# Patient Record
Sex: Female | Born: 1965 | Race: Black or African American | Hispanic: No | Marital: Single | Smoking: Never smoker
Health system: Southern US, Community
[De-identification: ages and names within clinical notes are randomized; demographics above are authoritative.]

## PROBLEM LIST (undated history)

## (undated) DIAGNOSIS — I10 Essential (primary) hypertension: Secondary | ICD-10-CM

## (undated) HISTORY — PX: ABDOMINAL HYSTERECTOMY: SHX81

---

## 2015-05-03 ENCOUNTER — Emergency Department
Admission: EM | Admit: 2015-05-03 | Discharge: 2015-05-03 | Disposition: A | Discharge status: Home / Self Care | Payer: Managed Care, Other (non HMO) | Attending: Emergency Medicine | Admitting: Emergency Medicine

## 2015-05-03 ENCOUNTER — Encounter: Payer: Self-pay | Admitting: Emergency Medicine

## 2015-05-03 DIAGNOSIS — N390 Urinary tract infection, site not specified: Secondary | ICD-10-CM | POA: Diagnosis not present

## 2015-05-03 DIAGNOSIS — Z79899 Other long term (current) drug therapy: Secondary | ICD-10-CM | POA: Insufficient documentation

## 2015-05-03 DIAGNOSIS — I10 Essential (primary) hypertension: Secondary | ICD-10-CM | POA: Diagnosis not present

## 2015-05-03 DIAGNOSIS — R350 Frequency of micturition: Secondary | ICD-10-CM | POA: Diagnosis present

## 2015-05-03 HISTORY — DX: Essential (primary) hypertension: I10

## 2015-05-03 LAB — URINALYSIS COMPLETE WITH MICROSCOPIC (ARMC ONLY)
Bacteria, UA: NONE SEEN
Bilirubin Urine: NEGATIVE
Glucose, UA: NEGATIVE mg/dL
Ketones, ur: NEGATIVE mg/dL
NITRITE: NEGATIVE
Protein, ur: NEGATIVE mg/dL
SPECIFIC GRAVITY, URINE: 1.015 (ref 1.005–1.030)
Squamous Epithelial / LPF: NONE SEEN
pH: 5 (ref 5.0–8.0)

## 2015-05-03 MED ORDER — CEPHALEXIN 500 MG PO CAPS: 500.0000 mg | ORAL_CAPSULE | Freq: Two times a day (BID) | ORAL | Status: AC

## 2015-05-03 NOTE — ED Provider Notes (Signed)
[facility] Emergency Department Provider Note  ____________________________________________  Time seen: Approximately 5:52 PM  I have reviewed the triage vital signs and the nursing notes.   HISTORY  Chief Complaint Urinary Frequency    HPI Gara Kroneramela Patnode is a 49 y.o. female with a history of occasional urinary tract infections and hypertension who presents with increased urinary frequency and discomfort while urinating for several days.  She has been going to the bathroom many times a day and always has discomfort which she describes as burning and pressure.  She has had no fever or chills, no abdominal pain, no flank pain, and no nausea or vomiting.  This feels similar to prior urinary tract infections.  She has been drinking cranberry juice but it is apparently not helped.   Past Medical History  Diagnosis Date  . Hypertension     There are no active problems to display for this patient.   Past Surgical History  Procedure Laterality Date  . Abdominal hysterectomy      Current Outpatient Rx  Name  Route  Sig  Dispense  Refill  . amLODipine (NORVASC) 5 MG tablet   Oral   Take 5 mg by mouth daily.         Marland Kitchen. aspirin 81 MG tablet   Oral   Take 81 mg by mouth daily.         Marland Kitchen. lisinopril-hydrochlorothiazide (PRINZIDE,ZESTORETIC) 20-25 MG per tablet   Oral   Take 1 tablet by mouth daily.         . cephALEXin (KEFLEX) 500 MG capsule   Oral   Take 1 capsule (500 mg total) by mouth 2 (two) times daily.   14 capsule   0     Allergies Review of patient's allergies indicates no known allergies.  History reviewed. No pertinent family history.  Social History History  Substance Use Topics  . Smoking status: Never Smoker   . Smokeless tobacco: Not on file  . Alcohol Use: No    Review of Systems Constitutional: No fever/chills Eyes: No visual changes. ENT: No sore throat. Cardiovascular: Denies chest pain. Respiratory: Denies  shortness of breath. Gastrointestinal: No abdominal pain.  No nausea, no vomiting.  No diarrhea.  No constipation. Genitourinary: Increased urinary frequency and discomfort Musculoskeletal: Negative for back pain nor flank pain Skin: Negative for rash. Neurological: Negative for headaches, focal weakness or numbness.  10-point ROS otherwise negative.  ____________________________________________   PHYSICAL EXAM:  VITAL SIGNS: ED Triage Vitals  Enc Vitals Group     BP 05/03/15 1647 143/83 mmHg     Pulse Rate 05/03/15 1647 84     Resp 05/03/15 1647 20     Temp 05/03/15 1647 97.9 F (36.6 C)     Temp Source 05/03/15 1647 Oral     SpO2 05/03/15 1647 99 %     Weight 05/03/15 1647 255 lb (115.667 kg)     Height 05/03/15 1647 5\' 7"  (1.702 m)     Head Cir --      Peak Flow --      Pain Score 05/03/15 1648 7     Pain Loc --      Pain Edu? --      Excl. in GC? --     Constitutional: Alert and oriented. Well appearing and in no acute distress. Eyes: Conjunctivae are normal. PERRL. EOMI. Head: Atraumatic. Nose: No congestion/rhinnorhea. Mouth/Throat: Mucous membranes are moist.  Oropharynx non-erythematous. Neck: No stridor.   Cardiovascular: Normal rate,  regular rhythm. Grossly normal heart sounds.  Good peripheral circulation. Respiratory: Normal respiratory effort.  No retractions. Lungs CTAB. Gastrointestinal: Soft and nontender. No distention. No abdominal bruits. No CVA tenderness. Musculoskeletal: No lower extremity tenderness nor edema.  No joint effusions. Neurologic:  Normal speech and language. No gross focal neurologic deficits are appreciated. Speech is normal. No gait instability. Skin:  Skin is warm, dry and intact. No rash noted. Psychiatric: Mood and affect are normal. Speech and behavior are normal.  ____________________________________________   LABS (all labs ordered are listed, but only abnormal results are displayed)  Labs Reviewed  URINALYSIS  COMPLETEWITH MICROSCOPIC ([facility])  - Abnormal; Notable for the following:    Color, Urine YELLOW (*)    APPearance CLOUDY (*)    Hgb urine dipstick 1+ (*)    Leukocytes, UA 3+ (*)    All other components within normal limits  URINE CULTURE   WBCs and RBCs = too numerous to count ____________________________________________  EKG  Not indicated ____________________________________________  RADIOLOGY  Not indicated  ____________________________________________   PROCEDURES  Procedure(s) performed: None  Critical Care performed: No  ____________________________________________   INITIAL IMPRESSION / ASSESSMENT AND PLAN / ED COURSE  Pertinent labs & imaging results that were available during my care of the patient were reviewed by me and considered in my medical decision making (see chart for details).  Uncomplicated urinary tract infection.  I will treat as per infectious disease recommendations with Keflex 500 mg twice a day 7 days.  I sent this sample to culture given that it was strongly positive and a want to ensure appropriate antibiotic coverage.  I gave the patient my usual and customary return precautions. ____________________________________________   FINAL CLINICAL IMPRESSION(S) / ED DIAGNOSES  Final diagnoses:  UTI (urinary tract infection), uncomplicated     Loleta Roseory Ayaan Shutes, MD 05/03/15 1805

## 2015-05-03 NOTE — ED Notes (Signed)
Having urinary freq and dysuria for the past few days

## 2015-05-03 NOTE — Discharge Instructions (Signed)
You have been seen in the Emergency Department (ED) today for pain when urinating.  Your workup today suggests that you have a urinary tract infection (UTI). ° °Please take your antibiotic as prescribed and over-the-counter pain medication (Tylenol or Motrin) as needed, but no more than recommended on the label instructions.  Drink PLENTY of fluids. ° °Call your regular doctor to schedule the next available appointment to follow up on today’s ED visit, or return immediately to the ED if your pain worsens, you have decreased urine production, develop fever, persistent vomiting, or other symptoms that concern you. ° ° °Urinary Tract Infection °Urinary tract infections (UTIs) can develop anywhere along your urinary tract. Your urinary tract is your body's drainage system for removing wastes and extra water. Your urinary tract includes two kidneys, two ureters, a bladder, and a urethra. Your kidneys are a pair of bean-shaped organs. Each kidney is about the size of your fist. They are located below your ribs, one on each side of your spine. °CAUSES °Infections are caused by microbes, which are microscopic organisms, including fungi, viruses, and bacteria. These organisms are so small that they can only be seen through a microscope. Bacteria are the microbes that most commonly cause UTIs. °SYMPTOMS  °Symptoms of UTIs may vary by age and gender of the patient and by the location of the infection. Symptoms in young women typically include a frequent and intense urge to urinate and a painful, burning feeling in the bladder or urethra during urination. Older women and men are more likely to be tired, shaky, and weak and have muscle aches and abdominal pain. A fever may mean the infection is in your kidneys. Other symptoms of a kidney infection include pain in your back or sides below the ribs, nausea, and vomiting. °DIAGNOSIS °To diagnose a UTI, your caregiver will ask you about your symptoms. Your caregiver also will ask to  provide a urine sample. The urine sample will be tested for bacteria and white blood cells. White blood cells are made by your body to help fight infection. °TREATMENT  °Typically, UTIs can be treated with medication. Because most UTIs are caused by a bacterial infection, they usually can be treated with the use of antibiotics. The choice of antibiotic and length of treatment depend on your symptoms and the type of bacteria causing your infection. °HOME CARE INSTRUCTIONS °· If you were prescribed antibiotics, take them exactly as your caregiver instructs you. Finish the medication even if you feel better after you have only taken some of the medication. °· Drink enough water and fluids to keep your urine clear or pale yellow. °· Avoid caffeine, tea, and carbonated beverages. They tend to irritate your bladder. °· Empty your bladder often. Avoid holding urine for long periods of time. °· Empty your bladder before and after sexual intercourse. °· After a bowel movement, women should cleanse from front to back. Use each tissue only once. °SEEK MEDICAL CARE IF:  °· You have back pain. °· You develop a fever. °· Your symptoms do not begin to resolve within 3 days. °SEEK IMMEDIATE MEDICAL CARE IF:  °· You have severe back pain or lower abdominal pain. °· You develop chills. °· You have nausea or vomiting. °· You have continued burning or discomfort with urination. °MAKE SURE YOU:  °· Understand these instructions. °· Will watch your condition. °· Will get help right away if you are not doing well or get worse. °Document Released: 09/11/2005 Document Revised: 06/02/2012 Document Reviewed: 01/10/2012 °  [employer]® Patient Information ©2015 [employer]. This information is not intended to replace advice given to you by your health care provider. Make sure you discuss any questions you have with your health care provider. ° °

## 2015-05-06 LAB — URINE CULTURE: Special Requests: NORMAL

## 2016-09-03 ENCOUNTER — Emergency Department: Payer: Managed Care, Other (non HMO)

## 2016-09-03 ENCOUNTER — Encounter: Payer: Self-pay | Admitting: Emergency Medicine

## 2016-09-03 ENCOUNTER — Emergency Department
Admission: EM | Admit: 2016-09-03 | Discharge: 2016-09-03 | Disposition: A | Discharge status: Home / Self Care | Payer: Managed Care, Other (non HMO) | Attending: Emergency Medicine | Admitting: Emergency Medicine

## 2016-09-03 DIAGNOSIS — Z7982 Long term (current) use of aspirin: Secondary | ICD-10-CM | POA: Diagnosis not present

## 2016-09-03 DIAGNOSIS — M7631 Iliotibial band syndrome, right leg: Secondary | ICD-10-CM | POA: Insufficient documentation

## 2016-09-03 DIAGNOSIS — Z79899 Other long term (current) drug therapy: Secondary | ICD-10-CM | POA: Diagnosis not present

## 2016-09-03 DIAGNOSIS — Z792 Long term (current) use of antibiotics: Secondary | ICD-10-CM | POA: Insufficient documentation

## 2016-09-03 DIAGNOSIS — I10 Essential (primary) hypertension: Secondary | ICD-10-CM | POA: Diagnosis not present

## 2016-09-03 DIAGNOSIS — M25561 Pain in right knee: Secondary | ICD-10-CM | POA: Diagnosis present

## 2016-09-03 MED ORDER — MELOXICAM 15 MG PO TABS: 15.0000 mg | ORAL_TABLET | Freq: Every day | ORAL | 0 refills | Status: DC

## 2016-09-03 NOTE — ED Triage Notes (Signed)
C/o right knee pain worst with bearing weight. Denies injury

## 2016-09-03 NOTE — Discharge Instructions (Signed)
May take Tylenol only in conjunction with meloxicam  Follow exit care instructions and exercises

## 2016-09-03 NOTE — ED Provider Notes (Signed)
[facility] Emergency Department Provider Note  ____________________________________________  Time seen: Approximately 10:26 AM  I have reviewed the triage vital signs and the nursing notes.   HISTORY  Chief Complaint Knee Pain    HPI Tracy Carter is a 50 y.o. female, NAD, presents to the emergency department with 2 day history of right knee pain.  Patient states that the pain begins in the lateral posterior aspect of the right knee and radiates up along the lateral thigh to the right hip.  She describes pain as sharp and achy and worse with weight-bearing activities.  She denies any redness, swelling, rash or skin sores.  Patient denies any history of trauma, injury or falls. She denies numbness, tingling, weakness, saddle paraesthesias, loss of bladder or bowel control.  Denies fevers, chills, body aches. No abdominal pain, nausea, vomiting. Patient reports taking 2 Arthritis strength BC's at a time with only temporary relief of pain.  She also has tried ibuprofen, Tylenol, Epsom salt bath with minimal and temporary relief of pain.  Patient reports that heating the knee seems to help more than icing.  Patient works long shifts and is on her feet most hours of the day.        Past Medical History:  Diagnosis Date  . Hypertension     There are no active problems to display for this patient.   Past Surgical History:  Procedure Laterality Date  . ABDOMINAL HYSTERECTOMY      Prior to Admission medications   Medication Sig Start Date End Date Taking? Authorizing Provider  amLODipine (NORVASC) 5 MG tablet Take 5 mg by mouth daily.    Historical Provider, MD  aspirin 81 MG tablet Take 81 mg by mouth daily.    Historical Provider, MD  cephALEXin (KEFLEX) 500 MG capsule Take 1 capsule (500 mg total) by mouth 2 (two) times daily. 05/03/15   Loleta Roseory Forbach, MD  lisinopril-hydrochlorothiazide (PRINZIDE,ZESTORETIC) 20-25 MG per tablet Take 1 tablet by mouth daily.     Historical Provider, MD  meloxicam (MOBIC) 15 MG tablet Take 1 tablet (15 mg total) by mouth daily. 09/03/16   Mikisha Roseland L Brylon Brenning, PA-C    Allergies Review of patient's allergies indicates no known allergies.  History reviewed. No pertinent family history.  Social History Social History  Substance Use Topics  . Smoking status: Never Smoker  . Smokeless tobacco: Not on file  . Alcohol use No     Review of Systems  Constitutional: No fever/chills. Cardiovascular: No chest pain. Respiratory: No shortness of breath.  Gastrointestinal: No abdominal pain.  No nausea, vomiting.   Genitourinary: Negative for dysuria. No hematuria. No urinary hesitancy, urgency or increased frequency. No urinary incontinence. Musculoskeletal: Positive for Right knee pain that radiates to the right hip. Negative for neck or back pain. Skin: Negative for rash, redness, swelling, abnormal warmth, skin sores. Neurological: Negative for numbness, wheezes, tingling. No saddle paresthesias nor loss of bowel or bladder control.  10-point ROS otherwise negative.  ____________________________________________   PHYSICAL EXAM:  VITAL SIGNS: ED Triage Vitals  Enc Vitals Group     BP 09/03/16 1023 (!) 143/84     Pulse Rate 09/03/16 1023 71     Resp 09/03/16 1023 20     Temp 09/03/16 1023 98.4 F (36.9 C)     Temp Source 09/03/16 1023 Oral     SpO2 09/03/16 1023 99 %     Weight 09/03/16 1022 267 lb (121.1 kg)     Height 09/03/16  1022 5\' 7"  (1.702 m)     Head Circumference --      Peak Flow --      Pain Score 09/03/16 1022 8     Pain Loc --      Pain Edu? --      Excl. in GC? --      Constitutional: Alert and oriented. Well appearing and in no acute distress. Eyes: Conjunctivae are normal Without icterus or injection Head: Atraumatic. Cardiovascular: Normal rate, regular rhythm. Normal S1 and S2.  Good peripheral circulation with 2+ pulses noted in the right lower extremity. Respiratory: Normal  respiratory effort without tachypnea or retractions. Lungs CTAB with breath sounds noted in all lung fields. Musculoskeletal:   Tenderness on palpation diffusely from the right hip down the right lateral thigh to the distal right lateral thigh/lateral knee. No laxity with varus or valgus stress. No laxity with anterior or posterior to her. Negative patellofemoral grind. No effusion.   ROM of hips, knees, ankles grossly normal bilaterally. Pain about the right lateral thigh increases with crossing the right leg over the left. Muscle strength 5/5.  Neurologic:  Normal speech and language. No gross focal neurologic deficits are appreciated.  Skin:  Skin is warm, dry and intact. No rash, redness, abnormal warmth, skin sores.  Psychiatric: Mood and affect are normal. Speech and behavior are normal. Patient exhibits appropriate insight and judgement.   ____________________________________________   LABS  None ____________________________________________  EKG  None ____________________________________________  RADIOLOGY I have personally viewed and evaluated these images (plain radiographs) as part of my medical decision making, as well as reviewing the written report by the radiologist.  Dg Knee Complete 4 Views Right  Result Date: 09/03/2016 CLINICAL DATA:  Throbbing RIGHT lateral knee pain since Sunday. EXAM: RIGHT KNEE - COMPLETE 4+ VIEW COMPARISON:  Is for FINDINGS: No evidence of fracture, dislocation, or joint effusion. No evidence of erosive arthropathy or other focal bone abnormality. Mild medial joint space narrowing. Soft tissues are unremarkable. IMPRESSION: Negative. Electronically Signed   By: Elsie Stain M.D.   On: 09/03/2016 10:54    ____________________________________________    PROCEDURES  Procedure(s) performed: None   Procedures   Medications - No data to display   ____________________________________________   INITIAL IMPRESSION / ASSESSMENT AND PLAN / ED  COURSE  Pertinent labs & imaging results that were available during my care of the patient were reviewed by me and considered in my medical decision making (see chart for details).  Clinical Course    Patient's diagnosis is consistent with right IT band syndrome. Patient will be discharged home with prescription for meloxicam. Patient instructed that she may take Tylenol with the meloxicam, but no NSAIDs.  Patient is also advised to complete light range of motion and stretching of the right hip and knee as discussed and outlined in the care.  Patient is to follow up with Dr. Martha Clan in orthopedics if symptoms persist past this treatment course. Patient is given ED precautions to return to the ED for any worsening or new symptoms.     ____________________________________________  FINAL CLINICAL IMPRESSION(S) / ED DIAGNOSES  Final diagnoses:  IT band syndrome, right      NEW MEDICATIONS STARTED DURING THIS VISIT:  Discharge Medication List as of 09/03/2016 11:21 AM    START taking these medications   Details  meloxicam (MOBIC) 15 MG tablet Take 1 tablet (15 mg total) by mouth daily., Starting Tue 09/03/2016, Print  Hope Pigeon, PA-C 09/03/16 1431    Sharyn Creamer, MD 09/03/16 [phone]

## 2018-01-06 IMAGING — DX DG KNEE COMPLETE 4+V*R*
5 series · 5 of 5 positions shown · non-contrast
Comparison: Is for

CLINICAL DATA: Throbbing RIGHT lateral knee pain since [REDACTED].

EXAM:
RIGHT KNEE - COMPLETE 4+ VIEW

[knee ap]
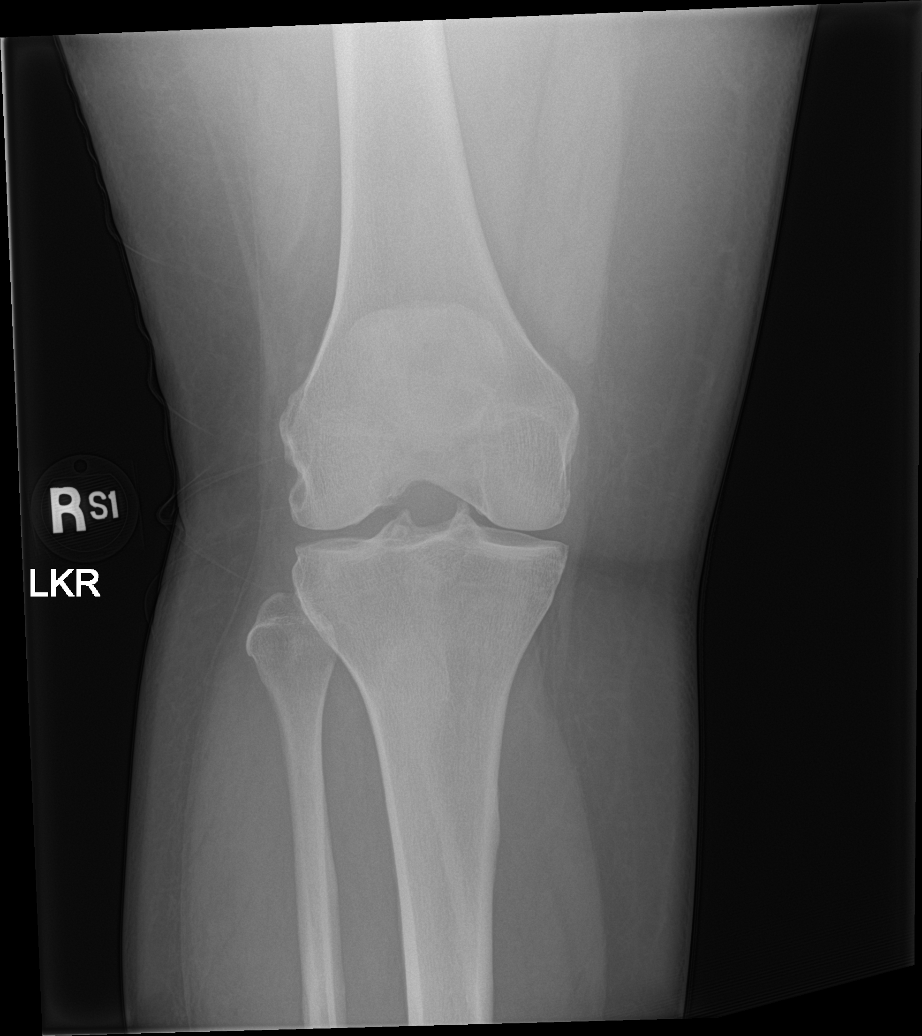

[knee obl (1 of 2)]
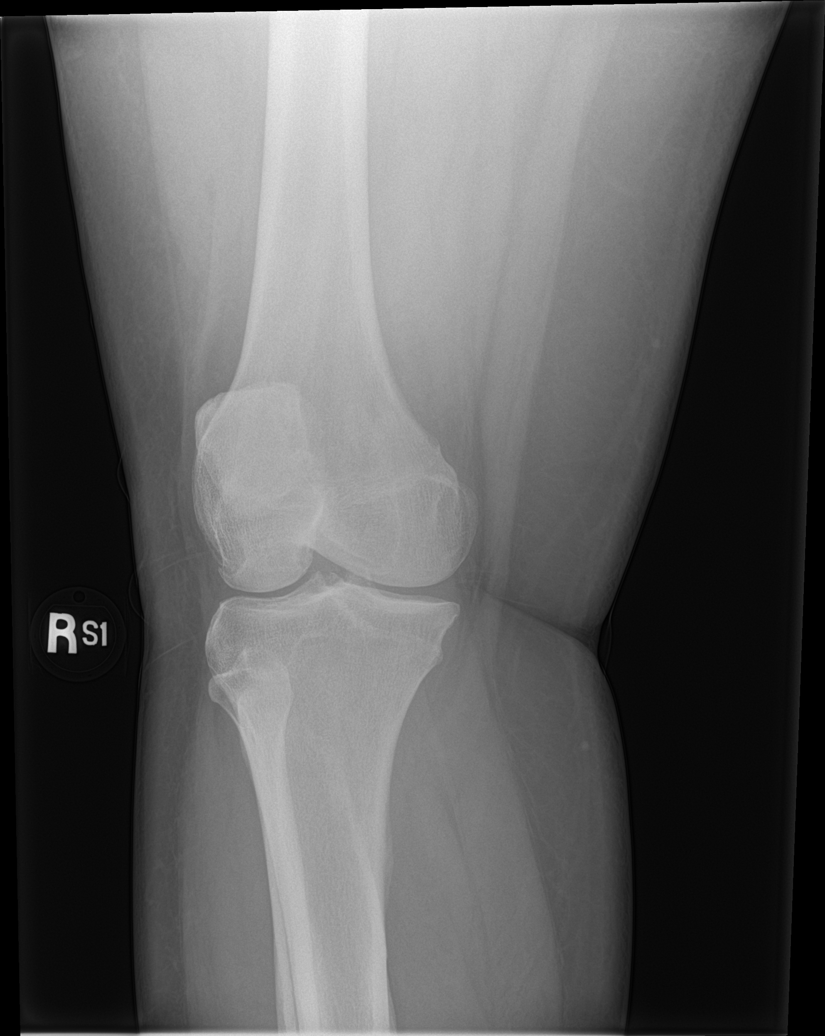

[knee obl (2 of 2)]
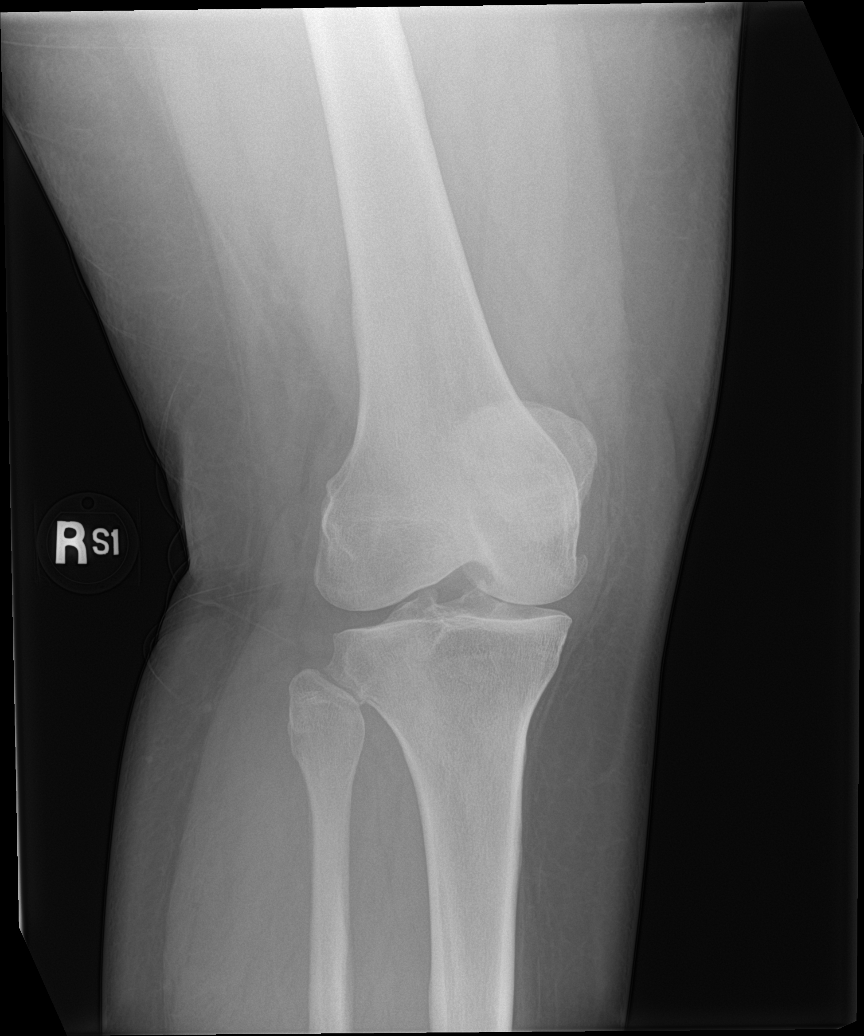

[knee lat (1 of 2)]
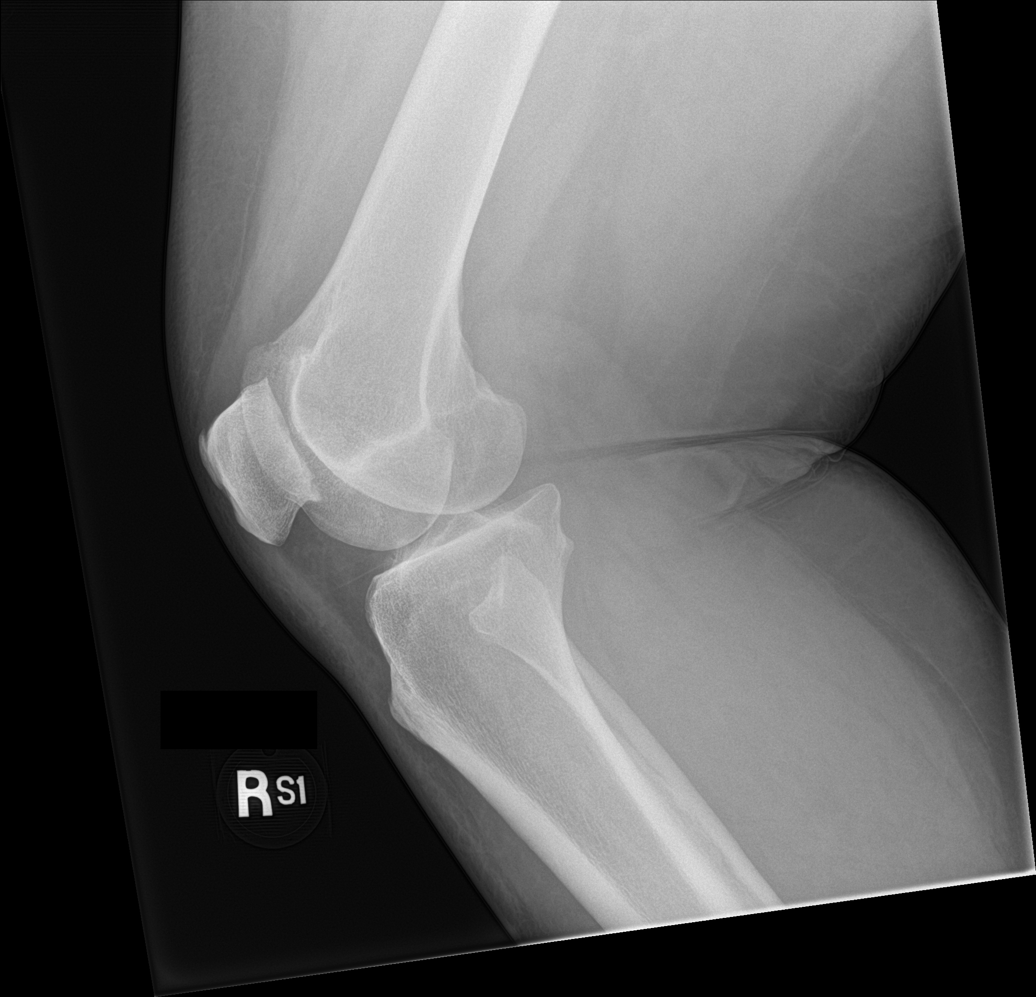

[knee lat (2 of 2)]
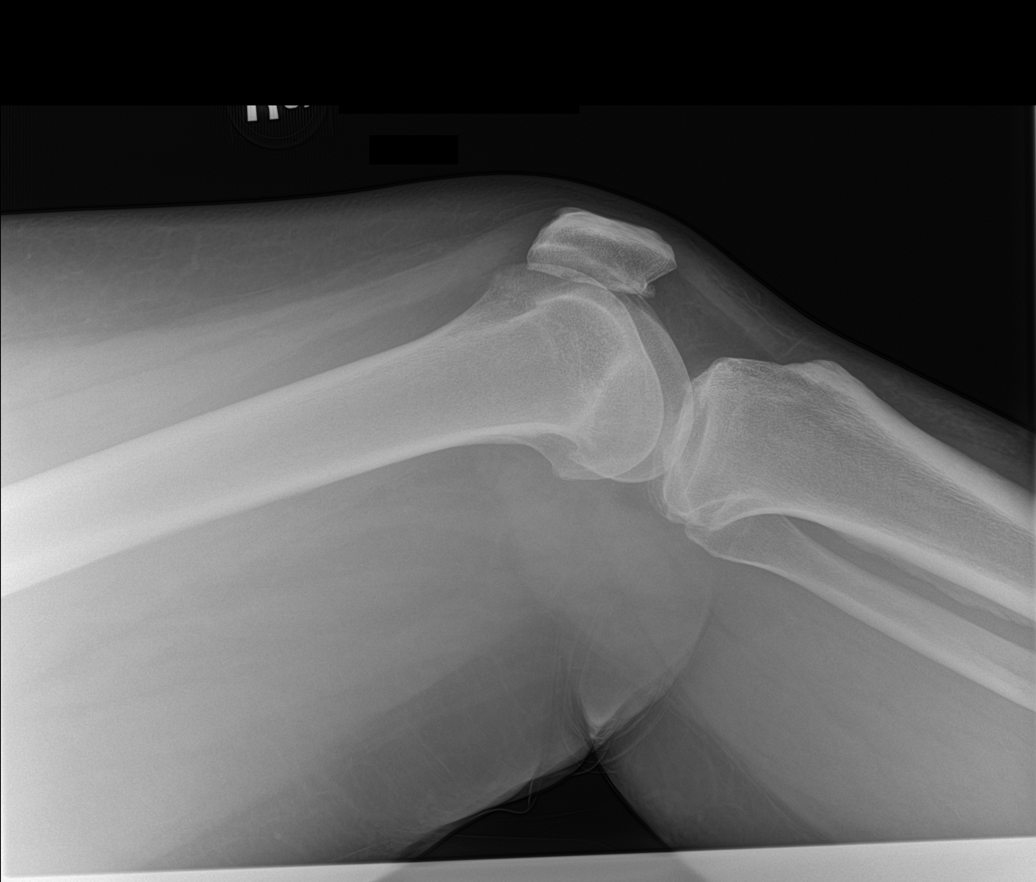

[5 of 5 positions shown; findings below may reference images not displayed]

FINDINGS: No evidence of fracture, dislocation, or joint effusion. No evidence
of erosive arthropathy or other focal bone abnormality. Mild medial
joint space narrowing. Soft tissues are unremarkable.
IMPRESSION: Negative.

## 2019-04-24 ENCOUNTER — Other Ambulatory Visit: Payer: Self-pay

## 2019-04-24 ENCOUNTER — Encounter: Payer: Self-pay | Admitting: Emergency Medicine

## 2019-04-24 ENCOUNTER — Emergency Department
Admission: EM | Admit: 2019-04-24 | Discharge: 2019-04-24 | Disposition: A | Discharge status: Home / Self Care | Payer: Managed Care, Other (non HMO) | Attending: Emergency Medicine | Admitting: Emergency Medicine

## 2019-04-24 ENCOUNTER — Emergency Department: Payer: Managed Care, Other (non HMO)

## 2019-04-24 DIAGNOSIS — M25562 Pain in left knee: Secondary | ICD-10-CM | POA: Diagnosis present

## 2019-04-24 DIAGNOSIS — Z79899 Other long term (current) drug therapy: Secondary | ICD-10-CM | POA: Insufficient documentation

## 2019-04-24 DIAGNOSIS — I10 Essential (primary) hypertension: Secondary | ICD-10-CM | POA: Diagnosis not present

## 2019-04-24 DIAGNOSIS — Z7982 Long term (current) use of aspirin: Secondary | ICD-10-CM | POA: Insufficient documentation

## 2019-04-24 MED ORDER — MELOXICAM 15 MG PO TABS: 15.0000 mg | ORAL_TABLET | Freq: Every day | ORAL | 1 refills | Status: AC

## 2019-04-24 MED ORDER — ACETAMINOPHEN 500 MG PO TABS
1000.0000 mg | ORAL_TABLET | Freq: Once | ORAL | Status: AC
  Administered 2019-04-24: 1000 mg via ORAL
  Filled 2019-04-24: qty 2

## 2019-04-24 NOTE — ED Notes (Signed)
Pt states left knee pain x 10 days with no known injury. Has been taking tylenol for pain.

## 2019-04-24 NOTE — ED Triage Notes (Signed)
Pt to ED via POV c/o left knee pain x 1 week, worse over the last few days. Pt denies any known injury. Pt in NAD.

## 2019-04-24 NOTE — ED Provider Notes (Signed)
[facility] Emergency Department Provider Note  ____________________________________________  Time seen: Approximately 4:02 PM  I have reviewed the triage vital signs and the nursing notes.   HISTORY  Chief Complaint Knee Pain    HPI Tracy Carter is a 53 y.o. female presents to the emergency department with acute onset left knee pain that started today.  Patient rates pain at 8 out of 10 in intensity and describes at the posterior aspect of her knee, worse with standing and relieved with rest.  She denies falls or mechanisms of trauma.  No recent surgeries, prolonged immobilization or daily smoking.  Patient does have a history of obesity.  No prior history of DVT or PE.  She has been taking Tylenol and has been applying Voltaren gel which has not been relieving her symptoms.  No other alleviating measures have been attempted.        Past Medical History:  Diagnosis Date  . Hypertension     There are no active problems to display for this patient.   Past Surgical History:  Procedure Laterality Date  . ABDOMINAL HYSTERECTOMY      Prior to Admission medications   Medication Sig Start Date End Date Taking? Authorizing Provider  amLODipine (NORVASC) 5 MG tablet Take 5 mg by mouth daily.    [provider]  aspirin 81 MG tablet Take 81 mg by mouth daily.    [provider]  cephALEXin (KEFLEX) 500 MG capsule Take 1 capsule (500 mg total) by mouth 2 (two) times daily. 05/03/15   Loleta Rose, MD  lisinopril-hydrochlorothiazide (PRINZIDE,ZESTORETIC) 20-25 MG per tablet Take 1 tablet by mouth daily.    [provider]  meloxicam (MOBIC) 15 MG tablet Take 1 tablet (15 mg total) by mouth daily for 7 days. 04/24/19 05/01/19  Orvil Feil, PA-C    Allergies Patient has no known allergies.  No family history on file.  Social History Social History   Tobacco Use  . Smoking status: Never Smoker  Substance Use Topics  .  Alcohol use: No  . Drug use: Not on file     Review of Systems  Constitutional: No fever/chills Eyes: No visual changes. No discharge ENT: No upper respiratory complaints. Cardiovascular: no chest pain. Respiratory: no cough. No SOB. Gastrointestinal: No abdominal pain.  No nausea, no vomiting.  No diarrhea.  No constipation. Genitourinary: Negative for dysuria. No hematuria Musculoskeletal: Patient has left knee pain.  Skin: Negative for rash, abrasions, lacerations, ecchymosis. Neurological: Negative for headaches, focal weakness or numbness.   ____________________________________________   PHYSICAL EXAM:  VITAL SIGNS: ED Triage Vitals  Enc Vitals Group     BP 04/24/19 1439 137/85     Pulse Rate 04/24/19 1439 94     Resp 04/24/19 1439 16     Temp 04/24/19 1439 97.9 F (36.6 C)     Temp Source 04/24/19 1439 Oral     SpO2 04/24/19 1439 95 %     Weight 04/24/19 1437 278 lb (126.1 kg)     Height 04/24/19 1437  (1.702 m)     Head Circumference --      Peak Flow --      Pain Score 04/24/19 1437 9     Pain Loc --      Pain Edu? --      Excl. in GC? --      Constitutional: Alert and oriented. Well appearing and in no acute distress. Eyes: Conjunctivae are normal. PERRL. EOMI. Head:  Atraumatic. Cardiovascular: Normal rate, regular rhythm. Normal S1 and S2.  Good peripheral circulation. Respiratory: Normal respiratory effort without tachypnea or retractions. Lungs CTAB. Good air entry to the bases with no decreased or absent breath sounds. Gastrointestinal: Bowel sounds 4 quadrants. Soft and nontender to palpation. No guarding or rigidity. No palpable masses. No distention. No CVA tenderness. Musculoskeletal: Patient performs full range of motion at the left knee.  She has pain with palpation over the medial side of left knee.  No deficits noted with provocative testing.  No popliteal fullness on the left.  Palpable dorsalis pedis pulse, left. Neurologic:  Normal  speech and language. No gross focal neurologic deficits are appreciated.  Skin:  Skin is warm, dry and intact. No rash noted. Psychiatric: Mood and affect are normal. Speech and behavior are normal. Patient exhibits appropriate insight and judgement.   ____________________________________________   LABS (all labs ordered are listed, but only abnormal results are displayed)  Labs Reviewed - No data to display ____________________________________________  EKG   ____________________________________________  RADIOLOGY  I personally viewed and evaluated these images as part of my medical decision making, as well as reviewing the written report by the radiologist.  Dg Knee 2 Views Left  Result Date: 04/24/2019 CLINICAL DATA:  Posterior left knee pain, no injury EXAM: LEFT KNEE - 1-2 VIEW COMPARISON:  None. FINDINGS: No fracture or dislocation of the left knee. Mild tricompartmental joint space narrowing and osteophytosis. No knee joint effusion. IMPRESSION: No fracture or dislocation of the left knee. Mild tricompartmental joint space narrowing and osteophytosis. No knee joint effusion. Electronically Signed   By: Lauralyn PrimesAlex  Bibbey M.D.   On: 04/24/2019 15:22   [place] Venous Img Lower Unilateral Left  Result Date: 04/24/2019 CLINICAL DATA:  53 year old female with knee pain EXAM: LEFT LOWER EXTREMITY VENOUS DOPPLER ULTRASOUND TECHNIQUE: Gray-scale sonography with graded compression, as well as color Doppler and duplex ultrasound were performed to evaluate the lower extremity deep venous systems from the level of the common femoral vein and including the common femoral, femoral, profunda femoral, popliteal and calf veins including the posterior tibial, peroneal and gastrocnemius veins when visible. The superficial great saphenous vein was also interrogated. Spectral Doppler was utilized to evaluate flow at rest and with distal augmentation maneuvers in the common femoral, femoral and popliteal veins.  COMPARISON:  None. FINDINGS: Contralateral Common Femoral Vein: Respiratory phasicity is normal and symmetric with the symptomatic side. No evidence of thrombus. Normal compressibility. Common Femoral Vein: No evidence of thrombus. Normal compressibility, respiratory phasicity and response to augmentation. Saphenofemoral Junction: No evidence of thrombus. Normal compressibility and flow on color Doppler imaging. Profunda Femoral Vein: No evidence of thrombus. Normal compressibility and flow on color Doppler imaging. Femoral Vein: No evidence of thrombus. Normal compressibility, respiratory phasicity and response to augmentation. Popliteal Vein: No evidence of thrombus. Normal compressibility, respiratory phasicity and response to augmentation. Calf Veins: Poor visualization of the tibial veins. No visualized thrombus. Superficial Great Saphenous Vein: No evidence of thrombus. Normal compressibility and flow on color Doppler imaging. Other Findings:  None. IMPRESSION: Sonographic survey of the left lower extremity negative for DVT Electronically Signed   By: Gilmer MorJaime  Wagner D.O.   On: 04/24/2019 17:02    ____________________________________________    PROCEDURES  Procedure(s) performed:    Procedures    Medications  acetaminophen (TYLENOL) tablet 1,000 mg (1,000 mg Oral Given 04/24/19 1645)     ____________________________________________   INITIAL IMPRESSION / ASSESSMENT AND PLAN / ED COURSE  Pertinent labs &  imaging results that were available during my care of the patient were reviewed by me and considered in my medical decision making (see chart for details).  Review of the [place] CSRS was performed in accordance of the [facility] prior to dispensing any controlled drugs.           Assessment and Plan:  Left knee pain Katina Woo is a 53 year old female presenting to the emergency department with acute posterior left knee pain.  On physical exam, patient had no redness or swelling of  the left lower extremity.  No recent risk factors for DVT.  She had no deficits noted with provocative testing.  Differential diagnosis originally included DVT versus arthritic pain.   No evidence of thromboembolism was identified on venous ultrasound of the left lower extremity.  Did note some spurring along the lateral and patellofemoral compartments of the knee.  Patient had some mild joint space narrowing along the medial compartment of the knee.  Patient was discharged with meloxicam and advised to follow-up with orthopedics, Dr. Joice Lofts.  A work note was offered but patient declined.  All patient questions were answered.    ____________________________________________  FINAL CLINICAL IMPRESSION(S) / ED DIAGNOSES  Final diagnoses:  Acute pain of left knee      NEW MEDICATIONS STARTED DURING THIS VISIT:  ED Discharge Orders         Ordered    meloxicam (MOBIC) 15 MG tablet  Daily     04/24/19 1720              This chart was dictated using voice recognition software/Dragon. Despite best efforts to proofread, errors can occur which can change the meaning. Any change was purely unintentional.    Orvil Feil, PA-C 04/24/19 1726    Emily Filbert, MD 04/24/19 [phone]

## 2019-10-05 ENCOUNTER — Other Ambulatory Visit: Payer: Self-pay | Admitting: Obstetrics and Gynecology

## 2019-10-05 DIAGNOSIS — Z1231 Encounter for screening mammogram for malignant neoplasm of breast: Secondary | ICD-10-CM

## 2019-11-13 IMAGING — US VENOUS DOPPLER ULTRASOUND OF LEFT LOWER EXTREMITY
1 series · 13 of 24 positions shown · non-contrast
Comparison: None.

CLINICAL DATA: 53-year-old female with knee pain



[Series 1: venous doppler ultrasound of left lower extremity · 0.08mm/px · 13 of 34 slices shown]
[im 1/34]
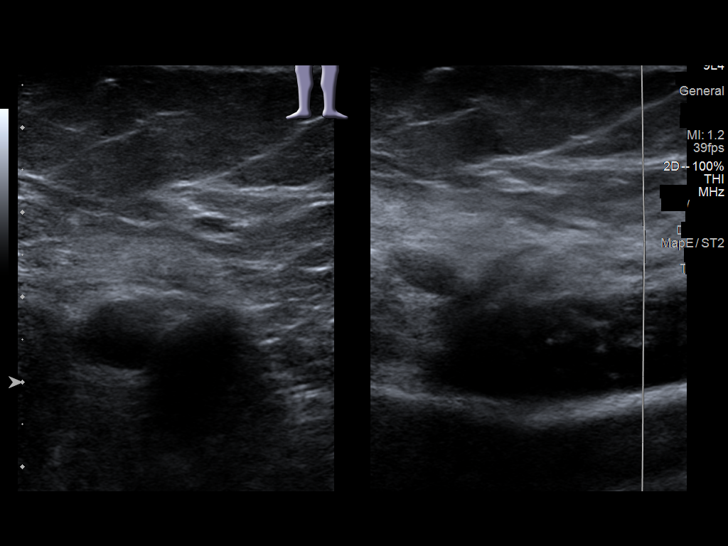
[im 3/34]
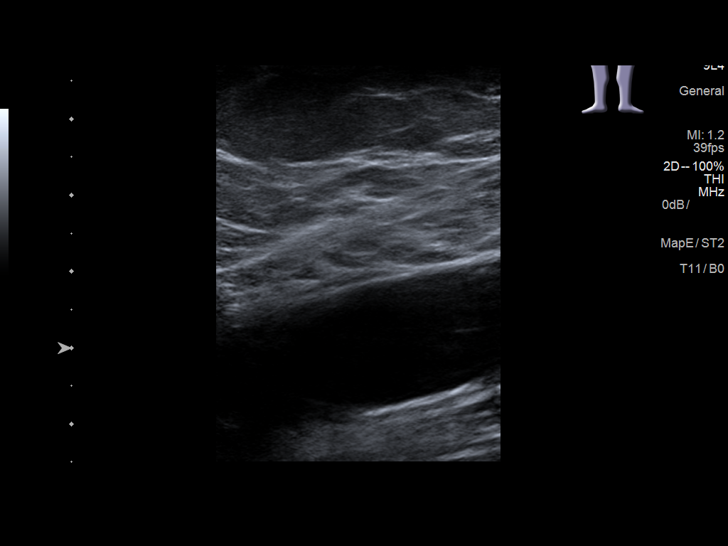
[im 6/34]
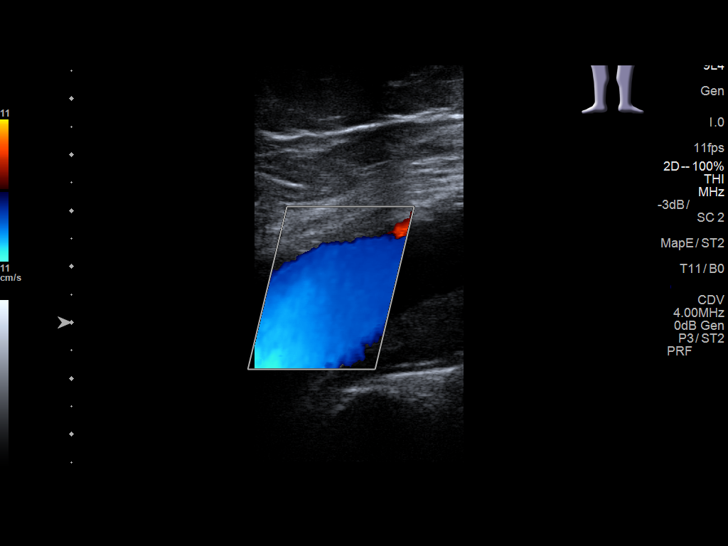
[im 9/34]
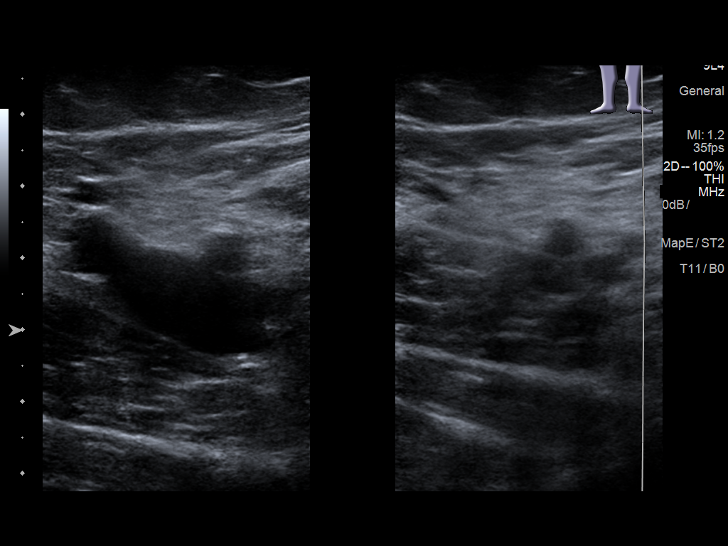
[im 12/34]
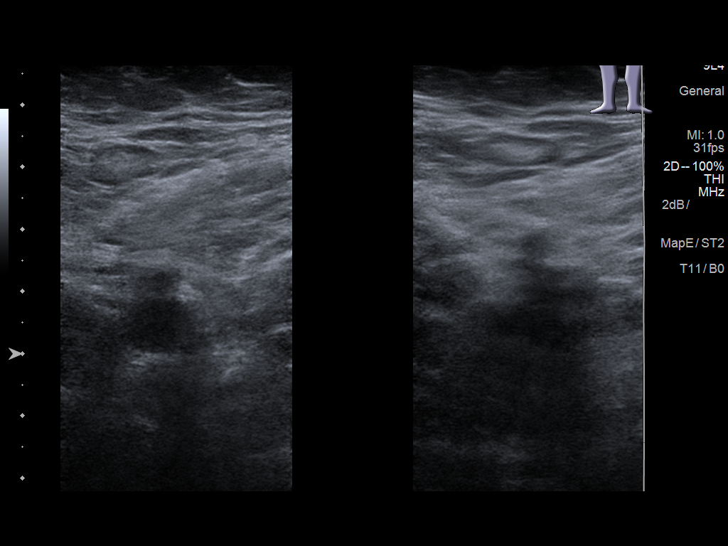
[im 15/34]
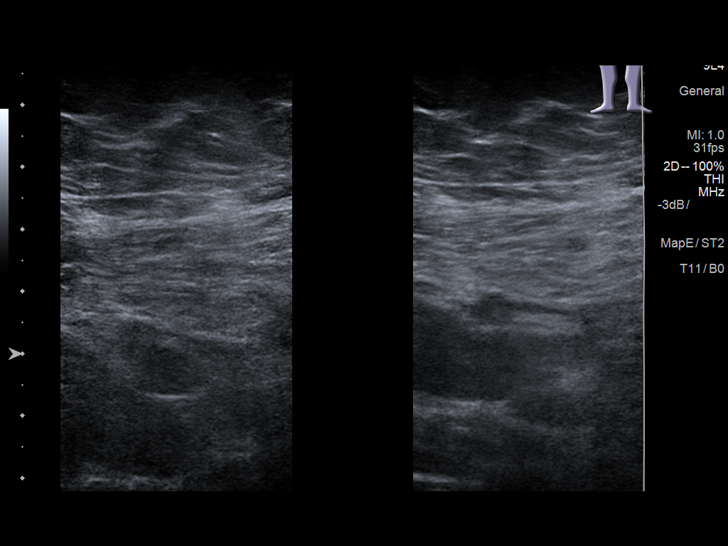
[im 18/34]
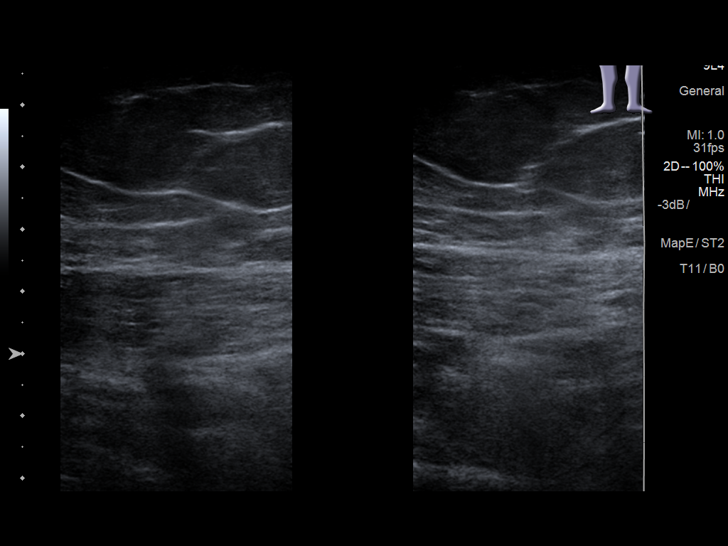
[im 19/34]
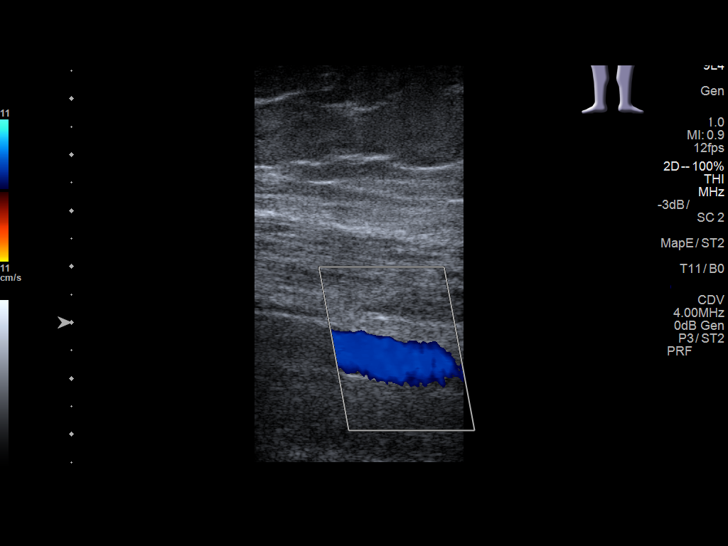
[im 22/34]
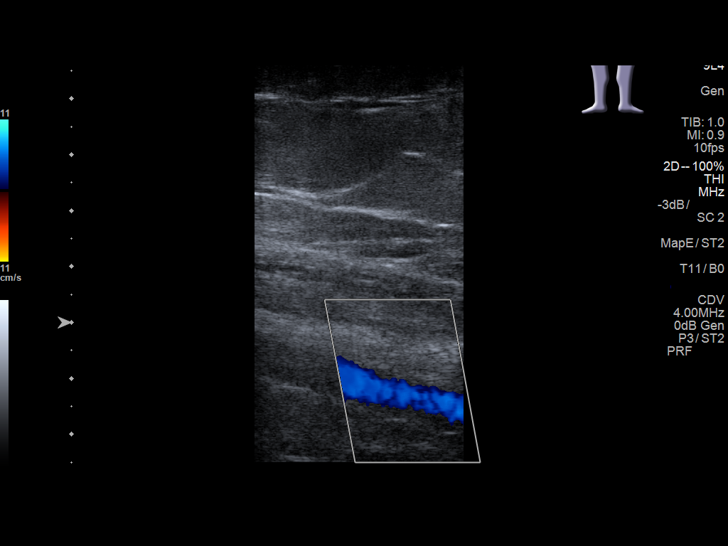
[im 25/34]
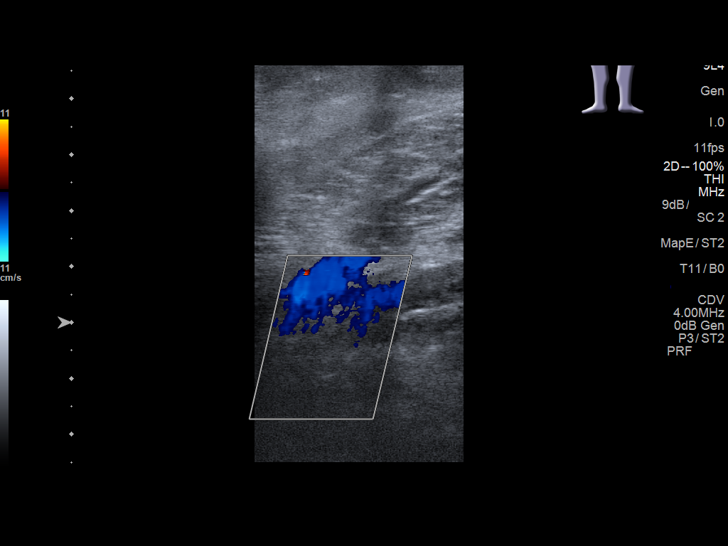
[im 28/34]
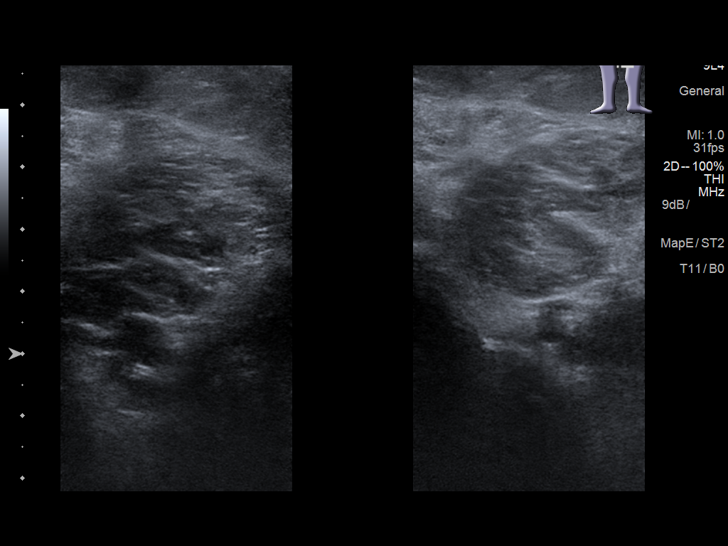
[im 31/34]
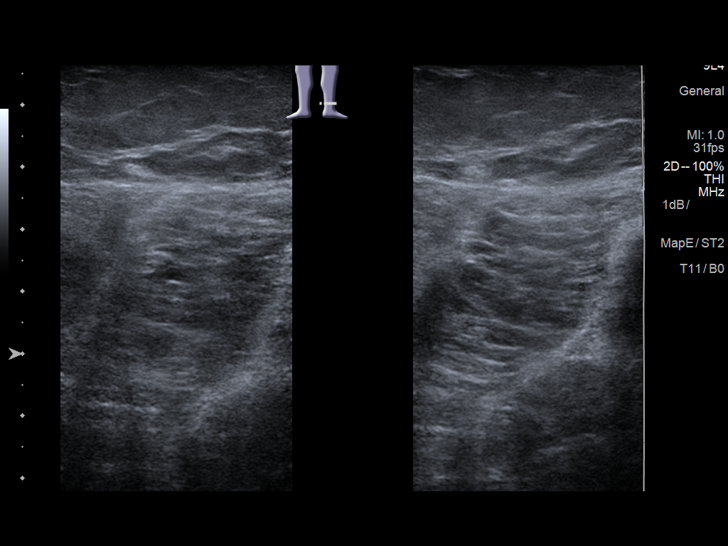
[im 34/34]
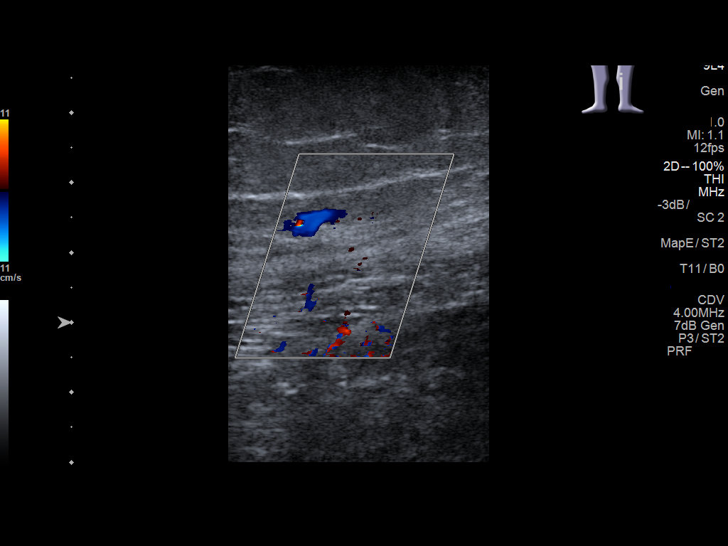

[13 of 24 positions shown; findings below may reference images not displayed]

FINDINGS: Contralateral Common Femoral Vein: Respiratory phasicity is normal
and symmetric with the symptomatic side. No evidence of thrombus.
Normal compressibility.

Common Femoral Vein: No evidence of thrombus. Normal
compressibility, respiratory phasicity and response to augmentation.

Saphenofemoral Junction: No evidence of thrombus. Normal
compressibility and flow on color Doppler imaging.

Profunda Femoral Vein: No evidence of thrombus. Normal
compressibility and flow on color Doppler imaging.

Femoral Vein: No evidence of thrombus. Normal compressibility,
respiratory phasicity and response to augmentation.

Popliteal Vein: No evidence of thrombus. Normal compressibility,
respiratory phasicity and response to augmentation.

Calf Veins: Poor visualization of the tibial veins. No visualized
thrombus.

Superficial Great Saphenous Vein: No evidence of thrombus. Normal
compressibility and flow on color Doppler imaging.

Other Findings:  None.
IMPRESSION: Sonographic survey of the left lower extremity negative for DVT

## 2020-04-22 ENCOUNTER — Emergency Department
Admission: EM | Admit: 2020-04-22 | Discharge: 2020-04-23 | Disposition: A | Discharge status: Home / Self Care | Payer: Managed Care, Other (non HMO) | Attending: Student | Admitting: Student

## 2020-04-22 ENCOUNTER — Other Ambulatory Visit: Payer: Self-pay

## 2020-04-22 DIAGNOSIS — R5383 Other fatigue: Secondary | ICD-10-CM | POA: Diagnosis present

## 2020-04-22 DIAGNOSIS — Z7982 Long term (current) use of aspirin: Secondary | ICD-10-CM | POA: Diagnosis not present

## 2020-04-22 DIAGNOSIS — M25569 Pain in unspecified knee: Secondary | ICD-10-CM | POA: Diagnosis not present

## 2020-04-22 DIAGNOSIS — I10 Essential (primary) hypertension: Secondary | ICD-10-CM | POA: Diagnosis not present

## 2020-04-22 DIAGNOSIS — R531 Weakness: Secondary | ICD-10-CM | POA: Insufficient documentation

## 2020-04-22 DIAGNOSIS — Z20822 Contact with and (suspected) exposure to covid-19: Secondary | ICD-10-CM | POA: Diagnosis not present

## 2020-04-22 DIAGNOSIS — Z79899 Other long term (current) drug therapy: Secondary | ICD-10-CM | POA: Insufficient documentation

## 2020-04-22 DIAGNOSIS — R208 Other disturbances of skin sensation: Secondary | ICD-10-CM | POA: Insufficient documentation

## 2020-04-22 DIAGNOSIS — R42 Dizziness and giddiness: Secondary | ICD-10-CM | POA: Diagnosis not present

## 2020-04-22 LAB — CBC
HCT: 42.2 % (ref 36.0–46.0)
Hemoglobin: 13.6 g/dL (ref 12.0–15.0)
MCH: 26 pg (ref 26.0–34.0)
MCHC: 32.2 g/dL (ref 30.0–36.0)
MCV: 80.5 fL (ref 80.0–100.0)
Platelets: 164 10*3/uL (ref 150–400)
RBC: 5.24 MIL/uL — ABNORMAL HIGH (ref 3.87–5.11)
RDW: 13.8 % (ref 11.5–15.5)
WBC: 5 10*3/uL (ref 4.0–10.5)
nRBC: 0 % (ref 0.0–0.2)

## 2020-04-22 LAB — COMPREHENSIVE METABOLIC PANEL
ALT: 18 U/L (ref 0–44)
AST: 18 U/L (ref 15–41)
Albumin: 3.9 g/dL (ref 3.5–5.0)
Alkaline Phosphatase: 49 U/L (ref 38–126)
Anion gap: 9 (ref 5–15)
BUN: 11 mg/dL (ref 6–20)
CO2: 26 mmol/L (ref 22–32)
Calcium: 8.9 mg/dL (ref 8.9–10.3)
Chloride: 102 mmol/L (ref 98–111)
Creatinine, Ser: 0.7 mg/dL (ref 0.44–1.00)
GFR calc Af Amer: >60 mL/min (ref >60)
GFR calc non Af Amer: >60 mL/min (ref >60)
Glucose, Bld: 141 mg/dL — ABNORMAL HIGH (ref 70–99)
Potassium: 3.5 mmol/L (ref 3.5–5.1)
Sodium: 137 mmol/L (ref 135–145)
Total Bilirubin: 0.8 mg/dL (ref 0.3–1.2)
Total Protein: 7.5 g/dL (ref 6.5–8.1)

## 2020-04-22 LAB — TROPONIN I (HIGH SENSITIVITY): Troponin I (High Sensitivity): <2 ng/L (ref <18)

## 2020-04-22 MED ORDER — KETOROLAC TROMETHAMINE 60 MG/2ML IM SOLN
30.0000 mg | Freq: Once | INTRAMUSCULAR | Status: AC
  Administered 2020-04-22: 30 mg via INTRAMUSCULAR
  Filled 2020-04-22: qty 2

## 2020-04-22 NOTE — ED Triage Notes (Signed)
Pt states she has not felt well since receiving her second covid immunization last week. Pt states she feels dizzy, lightheaded, weak. Pt appears in no acute distress, denies pain other than chronic knee pain. Pt denies vomiting, diarrhea but states has had nausea, denies known fever.

## 2020-04-22 NOTE — ED Notes (Signed)
Pt reports knee pain, she has an appointment with her primary for a steroid injection

## 2020-04-22 NOTE — ED Provider Notes (Signed)
[facility] Emergency Department Provider Note  ____________________________________________   First MD Initiated Contact with Patient 04/22/20 2314     (approximate)  I have reviewed the triage vital signs and the nursing notes.  History  Chief Complaint Dizziness and Nausea    HPI Tracy Carter is a 54 y.o. female with history of hypertension who presents to the emergency department for fatigue, feeling generally unwell, overall weak, lightheadedness, feeling hot/cold.  Symptoms have been ongoing since she received her second Moderna COVID vaccine on 4/28.  Symptoms first started the evening after her vaccine and have been constant since onset.  Patient states she was hit pretty hard immediately afterwards, and symptoms have been improving since onset, but due to the persistence/lingering she presented to the ER for further evaluation. Has been taking Tylenol and NSAIDs with mild improvement of her symptoms.  Nothing seems to make the symptoms worse.  She denies any headaches, visual changes, weakness, numbness, tingling.  No chest pain, shortness of breath, or difficulty breathing.  No vomiting or diarrhea.  No dysuria.  No sick contacts.  No palpitations or history of arrhythmias.   Past Medical Hx Past Medical History:  Diagnosis Date  . Hypertension     Problem List There are no problems to display for this patient.   Past Surgical Hx Past Surgical History:  Procedure Laterality Date  . ABDOMINAL HYSTERECTOMY      Medications Prior to Admission medications   Medication Sig Start Date End Date Taking? Authorizing Provider  amLODipine (NORVASC) 5 MG tablet Take 5 mg by mouth daily.    [provider]  aspirin 81 MG tablet Take 81 mg by mouth daily.    [provider]  cephALEXin (KEFLEX) 500 MG capsule Take 1 capsule (500 mg total) by mouth 2 (two) times daily. 05/03/15   Loleta Rose, MD  lisinopril-hydrochlorothiazide  (PRINZIDE,ZESTORETIC) 20-25 MG per tablet Take 1 tablet by mouth daily.    [provider]    Allergies Patient has no known allergies.  Family Hx No family history on file.  Social Hx Social History   Tobacco Use  . Smoking status: Never Smoker  Substance Use Topics  . Alcohol use: No  . Drug use: Not on file     Review of Systems  Constitutional: Positive for feeling hot/cold, generalized weakness, generally unwell, lightheadedness. Eyes: Negative for visual changes. ENT: Negative for sore throat. Cardiovascular: Negative for chest pain. Respiratory: Negative for shortness of breath. Gastrointestinal: Negative for nausea. Negative for vomiting.  Genitourinary: Negative for dysuria. Musculoskeletal: Negative for leg swelling. Skin: Negative for rash. Neurological: Negative for headaches.   Physical Exam  Vital Signs: ED Triage Vitals  Enc Vitals Group     BP 04/22/20 1925 (!) 160/82     Pulse Rate 04/22/20 1922 72     Resp 04/22/20 1922 20     Temp 04/22/20 1922 97.9 F (36.6 C)     Temp Source 04/22/20 1922 Oral     SpO2 04/22/20 1922 100 %     Weight 04/22/20 1921 281 lb (127.5 kg)     Height 04/22/20 1921 5\' 7"  (1.702 m)     Head Circumference --      Peak Flow --      Pain Score 04/22/20 1920 8     Pain Loc --      Pain Edu? --      Excl. in GC? --     Constitutional: Alert and  oriented. Well appearing. NAD.  Head: Normocephalic. Atraumatic. Eyes: Conjunctivae clear. Sclera anicteric. Pupils equal and symmetric. Nose: No masses or lesions. No congestion or rhinorrhea. Mouth/Throat: Wearing mask.  Neck: No stridor. Trachea midline.  Cardiovascular: Normal rate, regular rhythm. Extremities well perfused. Respiratory: Normal respiratory effort.  Lungs CTAB. Gastrointestinal: Soft. Non-distended. Non-tender.  Genitourinary: Deferred. Musculoskeletal: No lower extremity edema. No deformities. Neurologic:  Normal speech and language. No gross  focal or lateralizing neurologic deficits are appreciated. Alert and oriented.  Face symmetric.  Tongue midline.  Cranial nerves II through XII intact. UE and LE strength 5/5 and symmetric. UE and LE SILT.  Skin: Skin is warm, dry and intact. No rash noted. Psychiatric: Mood and affect are appropriate for situation.  EKG  Personally reviewed and interpreted by myself.   Date: 04/22/20 Time: 1920 Rate: 75 Rhythm: sinus Axis: borderline leftward  Intervals: PR 222 ms No acute ischemic changes, no acute arrhythmia  No STEMI   Procedures  Procedure(s) performed (including critical care):  .1-3 Lead EKG Interpretation Performed by: Lilia Pro., MD Authorized by: Lilia Pro., MD     Interpretation: normal     ECG rate assessment: normal     Rhythm: sinus rhythm   Comments:     Indication: Generalized weakness Impression: NSR     Initial Impression / Assessment and Plan / MDM / ED Course  54 y.o. female who presents to the ED for feeling generally unwell for the last week and a half after receiving her second COVID vaccine.  Symptoms include fatigue, generalized weakness, lightheadedness.  No palpitations, chest pain, trouble breathing.  No vomiting or diarrhea.  No neurological symptoms.  Ddx: post COVID symptoms, anemia, electrolyte abnormality, atypical ACS, arrhythmia.  Would be unfortunate and less likely, though possible for symptoms to be d/t COVID, theoretically possible if she did have an exposure between her 1st and 2nd vaccination, patient agreeable to testing today.  No neurological symptoms, non-focal neurological exam and no evidence of CN deficits to suggest acute intracranial pathology or CVT.   We will plan for labs, urine studies, cardiac monitoring  EKG without evidence of acute ischemia or arrhythmia.  High-sensitivity troponin negative. Electrolytes without actionable derangements.  No evidence of arrhythmia on cardiac monitoring.  Clinical Course as  of Apr 23 45  Sun Apr 23, 2020  0028 UA with bacteria, but otherwise negative LE, nitrites, WBCs.  As she denies any urinary symptoms will defer to culture.  COVID swab is pending, she understands if it is positive she will be able to see this in her MyChart or receive a phone call.  Otherwise, given negative work-up feel patient is stable for discharge with outpatient follow-up.  Patient agreeable.  Given return precautions.   [SM]    Clinical Course User Index [SM] Lilia Pro., MD     _______________________________   As part of my medical decision making I have reviewed available labs, radiology tests, reviewed old records/performed chart review.   Final Clinical Impression(s) / ED Diagnosis  Final diagnoses:  Fatigue, unspecified type  Lightheaded       Note:  This document was prepared using Dragon voice recognition software and may include unintentional dictation errors.   Lilia Pro., MD 04/23/20 [phone]

## 2020-04-23 LAB — URINALYSIS, COMPLETE (UACMP) WITH MICROSCOPIC
Bilirubin Urine: NEGATIVE
Glucose, UA: NEGATIVE mg/dL
Hgb urine dipstick: NEGATIVE
Ketones, ur: NEGATIVE mg/dL
Leukocytes,Ua: NEGATIVE
Nitrite: NEGATIVE
Protein, ur: NEGATIVE mg/dL
Specific Gravity, Urine: 1.013 (ref 1.005–1.030)
pH: 6 (ref 5.0–8.0)

## 2020-04-23 LAB — RESPIRATORY PANEL BY RT PCR (FLU A&B, COVID)
Influenza A by PCR: NEGATIVE
Influenza B by PCR: NEGATIVE
SARS Coronavirus 2 by RT PCR: NEGATIVE

## 2020-04-23 NOTE — Discharge Instructions (Addendum)
Thank you for letting us take care of you in the emergency department today.  Your blood work, including your electrolytes, blood levels, and heart marker (troponin) were within normal limits.  Your COVID swab is pending at this time, you will be able to see your results on your MyChart, and you will receive a phone call if they are positive.  Please continue to take your medications as prescribed.  Please follow-up with your primary care doctor to review your ER visit and follow-up on your symptoms.  Return to the ER for any new or worsening symptoms.

## 2020-04-24 LAB — URINE CULTURE

## 2020-08-26 IMAGING — CR LEFT KNEE - 1-2 VIEW
2 series · 2 of 2 positions shown · non-contrast
Comparison: None.

CLINICAL DATA: Posterior left knee pain, no injury

EXAM:
LEFT KNEE - 1-2 VIEW

[knee ap]
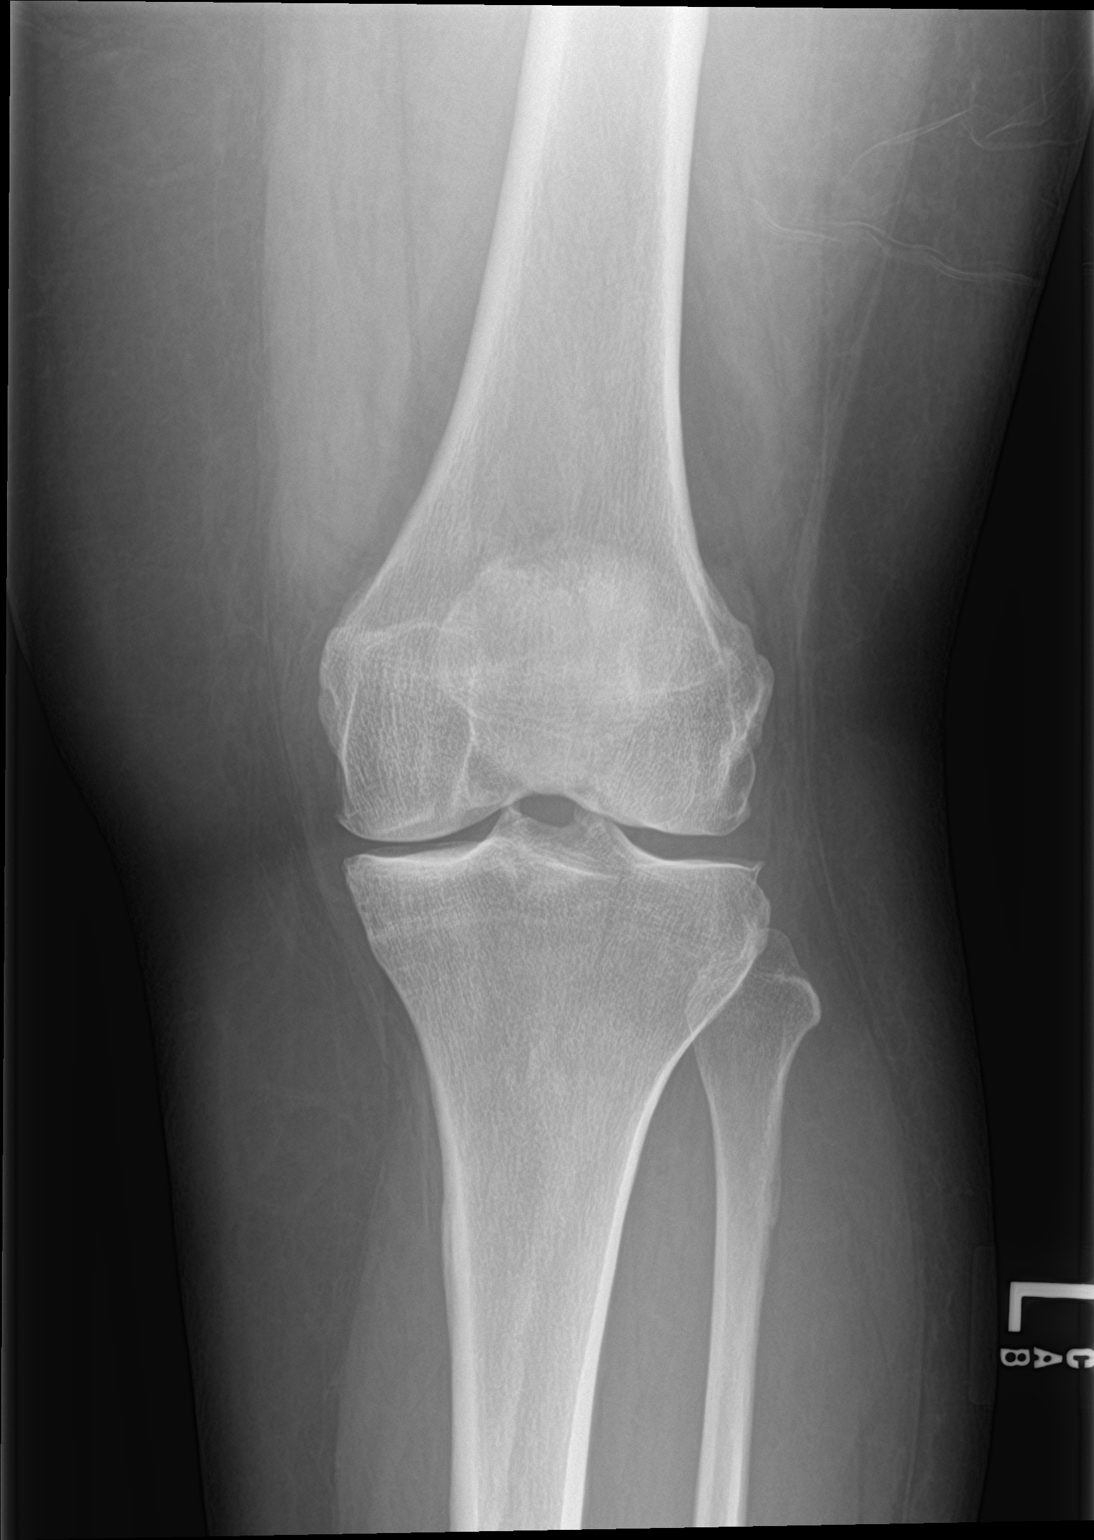

[knee lat]
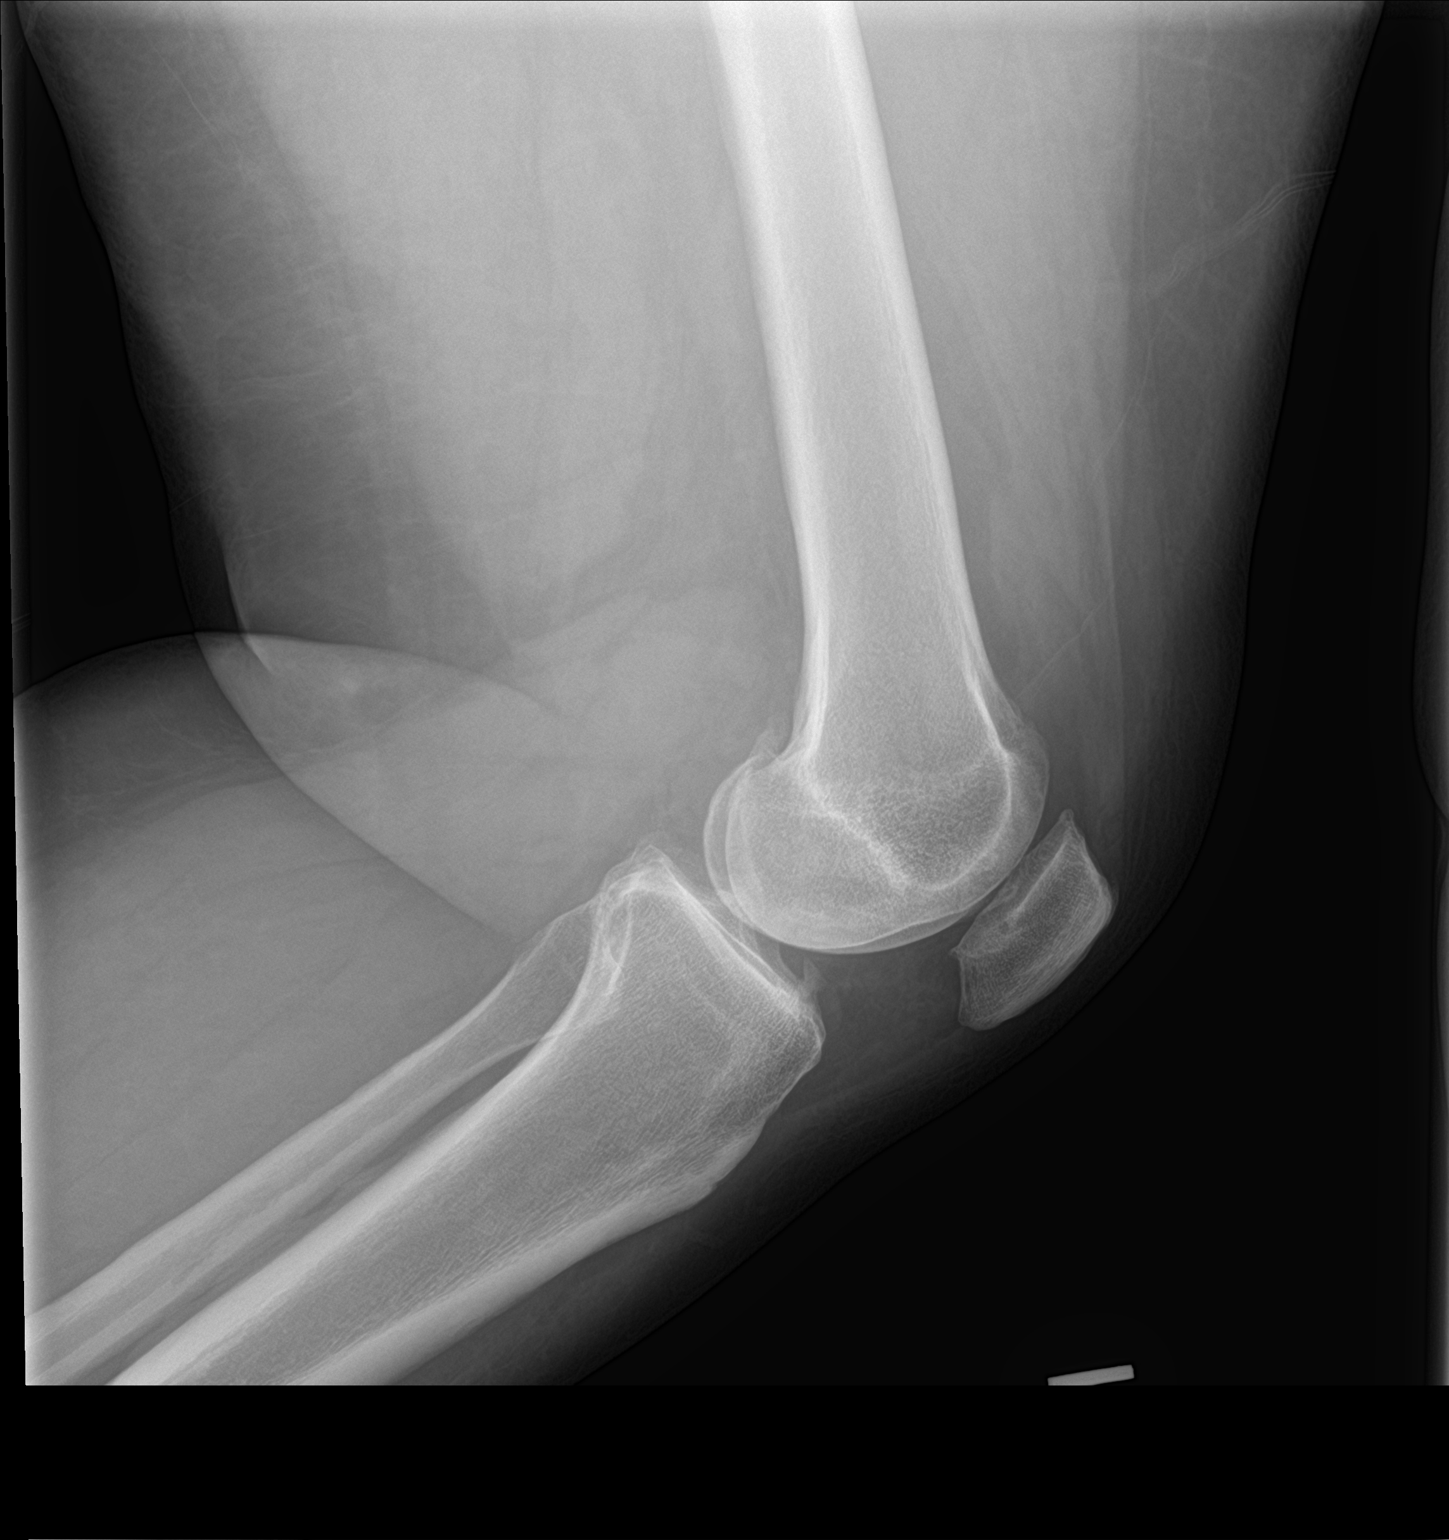

[2 of 2 positions shown; findings below may reference images not displayed]

FINDINGS: No fracture or dislocation of the left knee. Mild tricompartmental
joint space narrowing and osteophytosis. No knee joint effusion.
IMPRESSION: No fracture or dislocation of the left knee. Mild tricompartmental
joint space narrowing and osteophytosis. No knee joint effusion.

## 2023-04-16 DEATH — deceased
